# Patient Record
Sex: Female | Born: 2005 | Race: White | Hispanic: No | Marital: Single | Smoking: Never smoker
Health system: Southern US, Community
[De-identification: ages and names within clinical notes are randomized; demographics above are authoritative.]

---
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
## 2024-02-02 ENCOUNTER — Ambulatory Visit: Admitting: Family Medicine

## 2024-04-29 ENCOUNTER — Encounter (HOSPITAL_BASED_OUTPATIENT_CLINIC_OR_DEPARTMENT_OTHER): Payer: Self-pay

## 2024-04-29 ENCOUNTER — Emergency Department (HOSPITAL_BASED_OUTPATIENT_CLINIC_OR_DEPARTMENT_OTHER)
Admission: EM | Admit: 2024-04-29 | Discharge: 2024-04-30 | Disposition: A | Discharge status: Home / Self Care | Attending: Emergency Medicine | Admitting: Emergency Medicine

## 2024-04-29 ENCOUNTER — Other Ambulatory Visit: Payer: Self-pay

## 2024-04-29 DIAGNOSIS — R1012 Left upper quadrant pain: Secondary | ICD-10-CM | POA: Insufficient documentation

## 2024-04-29 LAB — CBC
HCT: 44.2 % (ref 36.0–46.0)
Hemoglobin: 14.8 g/dL (ref 12.0–15.0)
MCH: 27.9 pg (ref 26.0–34.0)
MCHC: 33.5 g/dL (ref 30.0–36.0)
MCV: 83.4 fL (ref 80.0–100.0)
Platelets: 391 K/uL (ref 150–400)
RBC: 5.3 MIL/uL — ABNORMAL HIGH (ref 3.87–5.11)
RDW: 14 % (ref 11.5–15.5)
WBC: 12.1 K/uL — ABNORMAL HIGH (ref 4.0–10.5)
nRBC: 0 % (ref 0.0–0.2)

## 2024-04-29 NOTE — ED Triage Notes (Signed)
 Upper L sided abdominal pain starting at 1300 today. Tender to palpation. Denies vomiting, change in bowel habits.

## 2024-04-30 ENCOUNTER — Emergency Department (HOSPITAL_BASED_OUTPATIENT_CLINIC_OR_DEPARTMENT_OTHER)

## 2024-04-30 LAB — COMPREHENSIVE METABOLIC PANEL WITH GFR
ALT: 13 U/L (ref 0–44)
AST: 18 U/L (ref 15–41)
Albumin: 4.9 g/dL (ref 3.5–5.0)
Alkaline Phosphatase: 95 U/L (ref 38–126)
Anion gap: 13 (ref 5–15)
BUN: 12 mg/dL (ref 6–20)
CO2: 22 mmol/L (ref 22–32)
Calcium: 10 mg/dL (ref 8.9–10.3)
Chloride: 103 mmol/L (ref 98–111)
Creatinine, Ser: 0.79 mg/dL (ref 0.44–1.00)
GFR, Estimated: >60 mL/min (ref >60)
Glucose, Bld: 92 mg/dL (ref 70–99)
Potassium: 4.1 mmol/L (ref 3.5–5.1)
Sodium: 138 mmol/L (ref 135–145)
Total Bilirubin: 0.5 mg/dL (ref 0.0–1.2)
Total Protein: 8.1 g/dL (ref 6.5–8.1)

## 2024-04-30 LAB — URINALYSIS, ROUTINE W REFLEX MICROSCOPIC
Bacteria, UA: NONE SEEN
Bilirubin Urine: NEGATIVE
Glucose, UA: NEGATIVE mg/dL
Hgb urine dipstick: NEGATIVE
Ketones, ur: 15 mg/dL — AB
Nitrite: NEGATIVE
Specific Gravity, Urine: 1.039 — ABNORMAL HIGH (ref 1.005–1.030)
pH: 6 (ref 5.0–8.0)

## 2024-04-30 LAB — PREGNANCY, URINE: Preg Test, Ur: NEGATIVE

## 2024-04-30 LAB — LIPASE, BLOOD: Lipase: 26 U/L (ref 11–51)

## 2024-04-30 MED ORDER — KETOROLAC TROMETHAMINE 30 MG/ML IJ SOLN
30.0000 mg | Freq: Once | INTRAMUSCULAR | Status: AC
  Administered 2024-04-30: 30 mg via INTRAVENOUS
  Filled 2024-04-30: qty 1

## 2024-04-30 MED ORDER — IOHEXOL 300 MG/ML  SOLN
65.0000 mL | Freq: Once | INTRAMUSCULAR | Status: AC | PRN
  Administered 2024-04-30: 85 mL via INTRAVENOUS

## 2024-04-30 MED ORDER — IBUPROFEN 600 MG PO TABS: 600.0000 mg | ORAL_TABLET | Freq: Four times a day (QID) | ORAL | 0 refills | Status: DC | PRN

## 2024-04-30 NOTE — Discharge Instructions (Signed)
 You were seen today for left upper quadrant pain.  Your workup is reassuring.  Trial ibuprofen  to see if this helps at all.

## 2024-04-30 NOTE — ED Provider Notes (Signed)
 [place] [facility] AT [facility] Provider Note   CSN: [id number] Arrival date & time: 04/29/24  2319     Patient presents with: Abdominal Pain   Colleen Conrad is a 18 y.o. female.   HPI     This is a 18 year old female who presents with left upper abdominal pain.  Patient states onset of symptoms around 12 hours ago.  Reports sharp left upper abdominal pain.  Worse with movement.  No change in bowels.  Denies nausea or vomiting.  Denies urinary symptoms.  No fevers.  No recent upper respiratory symptoms or sore throat.  Prior to Admission medications   Medication Sig Start Date End Date Taking? Authorizing Provider  ibuprofen  (ADVIL ) 600 MG tablet Take 1 tablet (600 mg total) by mouth every 6 (six) hours as needed. 04/30/24  Yes Rickiya Picariello, Charmaine FALCON, MD    Allergies: Patient has no allergy information on record.    Review of Systems  Constitutional:  Negative for fever.  Respiratory:  Negative for shortness of breath.   Cardiovascular:  Negative for chest pain.  Gastrointestinal:  Positive for abdominal pain. Negative for constipation, diarrhea, nausea and vomiting.  All other systems reviewed and are negative.   Updated Vital Signs BP 122/65   Pulse 72   Temp 98.2 F (36.8 C) (Oral)   Resp 18   LMP 04/13/2024 (Approximate)   SpO2 98%   Physical Exam Vitals and nursing note reviewed.  Constitutional:      Appearance: She is well-developed. She is not ill-appearing.     Comments: Overweight  HENT:     Head: Normocephalic and atraumatic.  Eyes:     Pupils: Pupils are equal, round, and reactive to light.  Cardiovascular:     Rate and Rhythm: Normal rate and regular rhythm.     Heart sounds: Normal heart sounds.  Pulmonary:     Effort: Pulmonary effort is normal. No respiratory distress.     Breath sounds: No wheezing.  Abdominal:     General: Bowel sounds are normal.     Palpations: Abdomen is soft.     Tenderness: There is abdominal  tenderness in the left upper quadrant. There is no guarding or rebound.  Musculoskeletal:     Cervical back: Neck supple.  Skin:    General: Skin is warm and dry.  Neurological:     Mental Status: She is alert and oriented to person, place, and time.  Psychiatric:        Mood and Affect: Mood normal.     (all labs ordered are listed, but only abnormal results are displayed) Labs Reviewed  CBC - Abnormal; Notable for the following components:      Result Value   WBC 12.1 (*)    RBC 5.30 (*)    All other components within normal limits  URINALYSIS, ROUTINE W REFLEX MICROSCOPIC - Abnormal; Notable for the following components:   Specific Gravity, Urine 1.039 (*)    Ketones, ur 15 (*)    Protein, ur TRACE (*)    Leukocytes,Ua SMALL (*)    All other components within normal limits  LIPASE, BLOOD  COMPREHENSIVE METABOLIC PANEL WITH GFR  PREGNANCY, URINE    EKG: None  Radiology: CT ABDOMEN PELVIS W CONTRAST Result Date: 04/30/2024 CLINICAL DATA:  Left side abdominal pain EXAM: CT ABDOMEN AND PELVIS WITH CONTRAST TECHNIQUE: Multidetector CT imaging of the abdomen and pelvis was performed using the standard protocol following bolus administration of intravenous contrast. RADIATION DOSE REDUCTION:  This exam was performed according to the departmental dose-optimization program which includes automated exposure control, adjustment of the mA and/or kV according to patient size and/or use of iterative reconstruction technique. CONTRAST:  85mL OMNIPAQUE  IOHEXOL  300 MG/ML  SOLN COMPARISON:  None Available. FINDINGS: Lower chest: No acute findings Hepatobiliary: No focal hepatic abnormality. Gallbladder unremarkable. Pancreas: No focal abnormality or ductal dilatation. Spleen: No focal abnormality.  Normal size. Adrenals/Urinary Tract: No adrenal abnormality. No focal renal abnormality. No stones or hydronephrosis. Urinary bladder is unremarkable. Stomach/Bowel: Normal appendix. Stomach, large and  small bowel grossly unremarkable. Vascular/Lymphatic: No evidence of aneurysm or adenopathy. Reproductive: Uterus and adnexa unremarkable.  No mass. Other: Trace free fluid in the cul-de-sac.  No free air. Musculoskeletal: No acute bony abnormality. IMPRESSION: No acute findings in the abdomen or pelvis. Electronically Signed   By: Franky Crease M.D.   On: 04/30/2024 01:23     Procedures   Medications Ordered in the ED  ketorolac  (TORADOL ) 30 MG/ML injection 30 mg (30 mg Intravenous Given 04/30/24 0023)  iohexol  (OMNIPAQUE ) 300 MG/ML solution 65 mL (85 mLs Intravenous Contrast Given 04/30/24 0116)    Clinical Course as of 04/30/24 0135  Mon Apr 30, 2024  0015 Discussed with patient and her mother lab results.  She does have significant left upper quadrant pain to palpation.  There are some more rare entities including spontaneous splenic bleed which are possibilities although hemoglobin would suggest otherwise.  They have opted for CT imaging. [CH]    Clinical Course User Index [CH] Nayara Taplin, Charmaine FALCON, MD                                 Medical Decision Making Amount and/or Complexity of Data Reviewed Labs: ordered. Radiology: ordered.  Risk Prescription drug management.   This patient presents to the ED for concern of abdominal pain, this involves an extensive number of treatment options, and is a complaint that carries with it a high risk of complications and morbidity.  I considered the following differential and admission for this acute, potentially life threatening condition.  The differential diagnosis includes colitis, kidney stone, pyelonephritis, splenic enlargement/injury, musculoskeletal etiology  MDM:    This is a 18 year old female who presents with left upper quadrant pain.  She is nontoxic and vital signs are reassuring.  She has tenderness to the left upper quadrant without rebound or guarding.  Labs obtained and reviewed.  Only notable for a white count of 12.1.  Normal  hemoglobin.  Normal CMP.  Urinalysis is not consistent with a UTI and urine pregnancy is negative.  See clinical course above.  CT imaging obtained.  No obvious intra-abdominal pathology.  While she is tender, could be musculoskeletal in nature.  Will trial anti-inflammatories.  (Labs, imaging, consults)  Labs: I Ordered, and personally interpreted labs.  The pertinent results include: CBC, CMP, urinalysis, urine pregnancy  Imaging Studies ordered: I ordered imaging studies including CT I independently visualized and interpreted imaging. I agree with the radiologist interpretation  Additional history obtained from chart review.  External records from outside source obtained and reviewed including prior evaluations  Cardiac Monitoring: The patient was maintained on a cardiac monitor.  If on the cardiac monitor, I personally viewed and interpreted the cardiac monitored which showed an underlying rhythm of: Sinus  Reevaluation: After the interventions noted above, I reevaluated the patient and found that they have :stayed the same  Social Determinants  of Health:  lives independently  Disposition: Discharge  Co morbidities that complicate the patient evaluation History reviewed. No pertinent past medical history.   Medicines Meds ordered this encounter  Medications   ketorolac  (TORADOL ) 30 MG/ML injection 30 mg   iohexol  (OMNIPAQUE ) 300 MG/ML solution 65 mL   ibuprofen  (ADVIL ) 600 MG tablet    Sig: Take 1 tablet (600 mg total) by mouth every 6 (six) hours as needed.    Dispense:  30 tablet    Refill:  0    I have reviewed the patients home medicines and have made adjustments as needed  Problem List / ED Course: Problem List Items Addressed This Visit   None Visit Diagnoses       Acute LUQ pain    -  Primary                Final diagnoses:  Acute LUQ pain    ED Discharge Orders          Ordered    ibuprofen  (ADVIL ) 600 MG tablet  Every 6 hours PRN         04/30/24 0129               Bari Charmaine FALCON, MD 04/30/24 [phone]

## 2024-05-01 ENCOUNTER — Emergency Department (HOSPITAL_BASED_OUTPATIENT_CLINIC_OR_DEPARTMENT_OTHER)
Admission: EM | Admit: 2024-05-01 | Discharge: 2024-05-01 | Disposition: A | Discharge status: Home / Self Care | Attending: Emergency Medicine | Admitting: Emergency Medicine

## 2024-05-01 ENCOUNTER — Other Ambulatory Visit (HOSPITAL_BASED_OUTPATIENT_CLINIC_OR_DEPARTMENT_OTHER): Payer: Self-pay

## 2024-05-01 ENCOUNTER — Other Ambulatory Visit: Payer: Self-pay

## 2024-05-01 ENCOUNTER — Encounter (HOSPITAL_BASED_OUTPATIENT_CLINIC_OR_DEPARTMENT_OTHER): Payer: Self-pay | Admitting: Emergency Medicine

## 2024-05-01 DIAGNOSIS — N3000 Acute cystitis without hematuria: Secondary | ICD-10-CM | POA: Diagnosis not present

## 2024-05-01 DIAGNOSIS — R9431 Abnormal electrocardiogram [ECG] [EKG]: Secondary | ICD-10-CM | POA: Diagnosis not present

## 2024-05-01 DIAGNOSIS — R1010 Upper abdominal pain, unspecified: Secondary | ICD-10-CM

## 2024-05-01 DIAGNOSIS — R1012 Left upper quadrant pain: Secondary | ICD-10-CM | POA: Diagnosis not present

## 2024-05-01 LAB — CBC WITH DIFFERENTIAL/PLATELET
Abs Immature Granulocytes: 0.02 K/uL (ref 0.00–0.07)
Basophils Absolute: 0.1 K/uL (ref 0.0–0.1)
Basophils Relative: 1 %
Eosinophils Absolute: 0.1 K/uL (ref 0.0–0.5)
Eosinophils Relative: 2 %
HCT: 43.8 % (ref 36.0–46.0)
Hemoglobin: 14.7 g/dL (ref 12.0–15.0)
Immature Granulocytes: 0 %
Lymphocytes Relative: 28 %
Lymphs Abs: 2.2 K/uL (ref 0.7–4.0)
MCH: 28.1 pg (ref 26.0–34.0)
MCHC: 33.6 g/dL (ref 30.0–36.0)
MCV: 83.6 fL (ref 80.0–100.0)
Monocytes Absolute: 0.5 K/uL (ref 0.1–1.0)
Monocytes Relative: 6 %
Neutro Abs: 5.2 K/uL (ref 1.7–7.7)
Neutrophils Relative %: 63 %
Platelets: 322 K/uL (ref 150–400)
RBC: 5.24 MIL/uL — ABNORMAL HIGH (ref 3.87–5.11)
RDW: 13.7 % (ref 11.5–15.5)
WBC: 8.1 K/uL (ref 4.0–10.5)
nRBC: 0 % (ref 0.0–0.2)

## 2024-05-01 LAB — URINALYSIS, ROUTINE W REFLEX MICROSCOPIC
Bilirubin Urine: NEGATIVE
Glucose, UA: NEGATIVE mg/dL
Hgb urine dipstick: NEGATIVE
Ketones, ur: 15 mg/dL — AB
Nitrite: NEGATIVE
Specific Gravity, Urine: 1.034 — ABNORMAL HIGH (ref 1.005–1.030)
pH: 6 (ref 5.0–8.0)

## 2024-05-01 LAB — COMPREHENSIVE METABOLIC PANEL WITH GFR
ALT: 13 U/L (ref 0–44)
AST: 15 U/L (ref 15–41)
Albumin: 4.6 g/dL (ref 3.5–5.0)
Alkaline Phosphatase: 91 U/L (ref 38–126)
Anion gap: 14 (ref 5–15)
BUN: 9 mg/dL (ref 6–20)
CO2: 21 mmol/L — ABNORMAL LOW (ref 22–32)
Calcium: 10 mg/dL (ref 8.9–10.3)
Chloride: 104 mmol/L (ref 98–111)
Creatinine, Ser: 0.8 mg/dL (ref 0.44–1.00)
GFR, Estimated: >60 mL/min (ref >60)
Glucose, Bld: 99 mg/dL (ref 70–99)
Potassium: 4.2 mmol/L (ref 3.5–5.1)
Sodium: 139 mmol/L (ref 135–145)
Total Bilirubin: 0.4 mg/dL (ref 0.0–1.2)
Total Protein: 7.7 g/dL (ref 6.5–8.1)

## 2024-05-01 LAB — LIPASE, BLOOD: Lipase: 29 U/L (ref 11–51)

## 2024-05-01 MED ORDER — FENTANYL CITRATE PF 50 MCG/ML IJ SOSY
50.0000 ug | PREFILLED_SYRINGE | Freq: Once | INTRAMUSCULAR | Status: AC
  Administered 2024-05-01: 50 ug via INTRAVENOUS
  Filled 2024-05-01: qty 1

## 2024-05-01 MED ORDER — CEPHALEXIN 500 MG PO CAPS
500.0000 mg | ORAL_CAPSULE | Freq: Four times a day (QID) | ORAL | 0 refills | Status: AC
  Filled 2024-05-01: qty 28, 7d supply, fill #0

## 2024-05-01 MED ORDER — OMEPRAZOLE 20 MG PO CPDR
20.0000 mg | DELAYED_RELEASE_CAPSULE | Freq: Every day | ORAL | 0 refills | Status: AC
  Filled 2024-05-01: qty 30, 30d supply, fill #0

## 2024-05-01 MED ORDER — SODIUM CHLORIDE 0.9 % IV BOLUS
1000.0000 mL | Freq: Once | INTRAVENOUS | Status: AC
  Administered 2024-05-01: 1000 mL via INTRAVENOUS

## 2024-05-01 NOTE — Discharge Instructions (Addendum)
 As we discussed, gradually increase your oral intake over the next several days starting with today only taking an liquids.  Progressively step up to soft or easier to digest foods as tolerated until you are eventually able to resume your normal diet over the next several days.  Otherwise, continue as prescribed medications as noted, follow-up with your primary care for continued management.

## 2024-05-01 NOTE — ED Triage Notes (Signed)
 C/o LUQ pain since 8/17. Seen here for same. Negative CT. +diarrhea.

## 2024-05-01 NOTE — ED Provider Notes (Signed)
 [place] EMERGENCY DEPARTMENT AT [facility] Provider Note   CSN: [id number] Arrival date & time: 05/01/24  9197     Patient presents with: Abdominal Pain   Colleen Conrad is a 18 y.o. female who presents to the ED today with continued left upper abdominal pain, states that it is worse with rotation of the torso and with forward bending at the waist.  She was seen on 17 August for the same, extensive workup undertaken at that time with CT not showing any acute abnormalities, and lab evaluation largely unremarkable.  Her primary concern today is that it feels like the left upper abdominal pain has worsened, and that she continues to have poor tolerance of oral intake.  In addition to the symptoms she has also had 2 loose stools this morning.  Endorses rare alcohol consumption, and occasional use of cannabis.  Has not used either in the last week.    Abdominal Pain Associated symptoms: diarrhea        Prior to Admission medications   Medication Sig Start Date End Date Taking? Authorizing Provider  cephALEXin  (KEFLEX ) 500 MG capsule Take 1 capsule (500 mg total) by mouth 4 (four) times daily for 7 days. 05/01/24 05/08/24 Yes Myriam Dorn BROCKS, PA  omeprazole  (PRILOSEC) 20 MG capsule Take 1 capsule (20 mg total) by mouth daily. 05/01/24  Yes Myriam Dorn BROCKS, PA  ibuprofen  (ADVIL ) 600 MG tablet Take 1 tablet (600 mg total) by mouth every 6 (six) hours as needed. 04/30/24   Horton, Charmaine FALCON, MD    Allergies: Patient has no allergy information on record.    Review of Systems  Gastrointestinal:  Positive for abdominal pain and diarrhea.  All other systems reviewed and are negative.   Updated Vital Signs BP 112/68   Pulse 62   Temp 98.9 F (37.2 C) (Oral)   Resp 15   LMP 04/13/2024 (Approximate)   SpO2 99%   Physical Exam Vitals and nursing note reviewed.  Constitutional:      General: She is not in acute distress.    Appearance: Normal appearance.  HENT:      Head: Normocephalic and atraumatic.     Mouth/Throat:     Mouth: Mucous membranes are moist.     Pharynx: Oropharynx is clear.  Eyes:     Extraocular Movements: Extraocular movements intact.     Conjunctiva/sclera: Conjunctivae normal.     Pupils: Pupils are equal, round, and reactive to light.  Cardiovascular:     Rate and Rhythm: Normal rate and regular rhythm.     Pulses: Normal pulses.     Heart sounds: Normal heart sounds. No murmur heard.    No friction rub. No gallop.  Pulmonary:     Effort: Pulmonary effort is normal.     Breath sounds: Normal breath sounds.  Abdominal:     General: Abdomen is flat. Bowel sounds are normal.     Palpations: Abdomen is soft.     Tenderness: There is generalized abdominal tenderness.  Musculoskeletal:        General: Normal range of motion.     Cervical back: Normal range of motion and neck supple.     Right lower leg: No edema.     Left lower leg: No edema.  Skin:    General: Skin is warm and dry.     Capillary Refill: Capillary refill takes less than 2 seconds.  Neurological:     General: No focal deficit present.  Mental Status: She is alert. Mental status is at baseline.  Psychiatric:        Mood and Affect: Mood normal.     (all labs ordered are listed, but only abnormal results are displayed) Labs Reviewed  COMPREHENSIVE METABOLIC PANEL WITH GFR - Abnormal; Notable for the following components:      Result Value   CO2 21 (*)    All other components within normal limits  CBC WITH DIFFERENTIAL/PLATELET - Abnormal; Notable for the following components:   RBC 5.24 (*)    All other components within normal limits  URINALYSIS, ROUTINE W REFLEX MICROSCOPIC - Abnormal; Notable for the following components:   Specific Gravity, Urine 1.034 (*)    Ketones, ur 15 (*)    Protein, ur TRACE (*)    Leukocytes,Ua MODERATE (*)    Bacteria, UA RARE (*)    All other components within normal limits  LIPASE, BLOOD    EKG: EKG  Interpretation Date/Time:  Tuesday May 01 2024 10:55:16 EDT Ventricular Rate:  54 PR Interval:  133 QRS Duration:  87 QT Interval:  431 QTC Calculation: 409 R Axis:   88  Text Interpretation: Sinus rhythm no prior ECG for comaprison No STEMI Confirmed by Ginger Barefoot ([id number]) on 05/01/2024 11:14:39 AM  Radiology: CT ABDOMEN PELVIS W CONTRAST Result Date: 04/30/2024 CLINICAL DATA:  Left side abdominal pain EXAM: CT ABDOMEN AND PELVIS WITH CONTRAST TECHNIQUE: Multidetector CT imaging of the abdomen and pelvis was performed using the standard protocol following bolus administration of intravenous contrast. RADIATION DOSE REDUCTION: This exam was performed according to the departmental dose-optimization program which includes automated exposure control, adjustment of the mA and/or kV according to patient size and/or use of iterative reconstruction technique. CONTRAST:  85mL OMNIPAQUE  IOHEXOL  300 MG/ML  SOLN COMPARISON:  None Available. FINDINGS: Lower chest: No acute findings Hepatobiliary: No focal hepatic abnormality. Gallbladder unremarkable. Pancreas: No focal abnormality or ductal dilatation. Spleen: No focal abnormality.  Normal size. Adrenals/Urinary Tract: No adrenal abnormality. No focal renal abnormality. No stones or hydronephrosis. Urinary bladder is unremarkable. Stomach/Bowel: Normal appendix. Stomach, large and small bowel grossly unremarkable. Vascular/Lymphatic: No evidence of aneurysm or adenopathy. Reproductive: Uterus and adnexa unremarkable.  No mass. Other: Trace free fluid in the cul-de-sac.  No free air. Musculoskeletal: No acute bony abnormality. IMPRESSION: No acute findings in the abdomen or pelvis. Electronically Signed   By: Franky Crease M.D.   On: 04/30/2024 01:23     Procedures   Medications Ordered in the ED  fentaNYL  (SUBLIMAZE ) injection 50 mcg (50 mcg Intravenous Given 05/01/24 1134)  sodium chloride  0.9 % bolus 1,000 mL (0 mLs Intravenous Stopped 05/01/24 1340)                                     Medical Decision Making Amount and/or Complexity of Data Reviewed Labs: ordered.  Risk Prescription drug management.   Medical Decision Making:   Colleen Conrad is a 18 y.o. female who presented to the ED today with left upper abdominal pain detailed above.    External chart has been reviewed including previous labs and imaging. Complete initial physical exam performed, notably the patient  was alert oriented no apparent distress with exquisite tenderness noted to the left upper quadrant..    Reviewed and confirmed nursing documentation for past medical history, family history, social history.    Initial Assessment:   With the patient's presentation  of upper abdominal pain, consider differential diagnosis of acute pancreatitis, gastritis, hepatobiliary disease, diverticulitis, bowel obstruction, mesenteric ischemia, UTI.     Initial Plan:  As she had imaging within the last 24 hours of the abdomen without any acute findings, defer imaging at this time. Screening labs including CBC and Metabolic panel to evaluate for infectious or metabolic etiology of disease.  Include serum lipase. Urinalysis with reflex culture ordered to evaluate for UTI or relevant urologic/nephrologic pathology.  EKG to evaluate for cardiac pathology Objective evaluation as below reviewed   Initial Study Results:   Laboratory  All laboratory results reviewed without evidence of clinically relevant pathology.   Exceptions include: UA does show moderate leukocytes with trace proteins and presence of bacteria with 0-5 squamous epithelial cells.  EKG EKG was reviewed independently. Rate, rhythm, axis, intervals all examined and without medically relevant abnormality. ST segments without concerns for elevations.    Radiology:  All images reviewed independently. Agree with radiology report at this time.   CT ABDOMEN PELVIS W CONTRAST Result Date: 04/30/2024 CLINICAL DATA:   Left side abdominal pain EXAM: CT ABDOMEN AND PELVIS WITH CONTRAST TECHNIQUE: Multidetector CT imaging of the abdomen and pelvis was performed using the standard protocol following bolus administration of intravenous contrast. RADIATION DOSE REDUCTION: This exam was performed according to the departmental dose-optimization program which includes automated exposure control, adjustment of the mA and/or kV according to patient size and/or use of iterative reconstruction technique. CONTRAST:  85mL OMNIPAQUE  IOHEXOL  300 MG/ML  SOLN COMPARISON:  None Available. FINDINGS: Lower chest: No acute findings Hepatobiliary: No focal hepatic abnormality. Gallbladder unremarkable. Pancreas: No focal abnormality or ductal dilatation. Spleen: No focal abnormality.  Normal size. Adrenals/Urinary Tract: No adrenal abnormality. No focal renal abnormality. No stones or hydronephrosis. Urinary bladder is unremarkable. Stomach/Bowel: Normal appendix. Stomach, large and small bowel grossly unremarkable. Vascular/Lymphatic: No evidence of aneurysm or adenopathy. Reproductive: Uterus and adnexa unremarkable.  No mass. Other: Trace free fluid in the cul-de-sac.  No free air. Musculoskeletal: No acute bony abnormality. IMPRESSION: No acute findings in the abdomen or pelvis. Electronically Signed   By: Franky Crease M.D.   On: 04/30/2024 01:23    Reassessment and Plan:   Workup is otherwise reassuring except for new findings of increased leukocyte esterase in the urine along with presence of bacteria without substantial squamous epithelium in the sample.  Given this finding, will begin treatment for UTI.  Further provided patient with a liter of IV fluids and this helped improve her discomfort prior to discharge along with IV fentanyl .  Further pain management was recommended with over-the-counter medications, follow-up with primary care for monitoring of her progression.  She understands and agrees, has no further concerns at this time.        Final diagnoses:  Acute cystitis without hematuria  Upper abdominal pain    ED Discharge Orders          Ordered    cephALEXin  (KEFLEX ) 500 MG capsule  4 times daily        05/01/24 1333    omeprazole  (PRILOSEC) 20 MG capsule  Daily        05/01/24 1333               Myriam Dorn BROCKS, [place] 05/01/24 1858    Tegeler, Lonni PARAS, MD 05/04/24 [phone]

## 2024-05-08 ENCOUNTER — Ambulatory Visit (INDEPENDENT_AMBULATORY_CARE_PROVIDER_SITE_OTHER)

## 2024-05-08 VITALS — BP 113/69 | HR 76 | Temp 98.1°F | Ht 64.0 in | Wt 188.0 lb

## 2024-05-08 DIAGNOSIS — M5442 Lumbago with sciatica, left side: Secondary | ICD-10-CM | POA: Diagnosis not present

## 2024-05-08 DIAGNOSIS — R5383 Other fatigue: Secondary | ICD-10-CM | POA: Insufficient documentation

## 2024-05-08 DIAGNOSIS — Z13228 Encounter for screening for other metabolic disorders: Secondary | ICD-10-CM | POA: Diagnosis not present

## 2024-05-08 DIAGNOSIS — G8929 Other chronic pain: Secondary | ICD-10-CM | POA: Diagnosis not present

## 2024-05-08 DIAGNOSIS — N3001 Acute cystitis with hematuria: Secondary | ICD-10-CM

## 2024-05-08 DIAGNOSIS — M545 Low back pain, unspecified: Secondary | ICD-10-CM | POA: Insufficient documentation

## 2024-05-08 DIAGNOSIS — Z1321 Encounter for screening for nutritional disorder: Secondary | ICD-10-CM

## 2024-05-08 DIAGNOSIS — Z130 Encounter for screening for diseases of the blood and blood-forming organs and certain disorders involving the immune mechanism: Secondary | ICD-10-CM

## 2024-05-08 DIAGNOSIS — F411 Generalized anxiety disorder: Secondary | ICD-10-CM | POA: Insufficient documentation

## 2024-05-08 DIAGNOSIS — Z1329 Encounter for screening for other suspected endocrine disorder: Secondary | ICD-10-CM | POA: Diagnosis not present

## 2024-05-08 DIAGNOSIS — Z1159 Encounter for screening for other viral diseases: Secondary | ICD-10-CM

## 2024-05-08 DIAGNOSIS — N390 Urinary tract infection, site not specified: Secondary | ICD-10-CM | POA: Insufficient documentation

## 2024-05-08 MED ORDER — MELOXICAM 15 MG PO TABS: 15.0000 mg | ORAL_TABLET | Freq: Every day | ORAL | 2 refills | Status: DC

## 2024-05-08 MED ORDER — SERTRALINE HCL 50 MG PO TABS: 50.0000 mg | ORAL_TABLET | Freq: Every day | ORAL | 2 refills | Status: DC

## 2024-05-08 MED ORDER — HYDROXYZINE PAMOATE 50 MG PO CAPS: 50.0000 mg | ORAL_CAPSULE | Freq: Three times a day (TID) | ORAL | 0 refills | Status: DC | PRN

## 2024-05-08 NOTE — Progress Notes (Signed)
 New Patient Office Visit  Subjective    Patient ID: Colleen Conrad, female    DOB: 04/26/2006  Age: 18 y.o. MRN: [id number]  CC:  Chief Complaint  Patient presents with   New Patient (Initial Visit)   History of Present Illness     HPI Colleen Conrad presents to establish care. This is her first primary care appointment. Denies any significant PMH and is not on any daily medications.  Screenings:  Colon Cancer: N/a - denies fam hx Lung Cancer: N/A - nonsmoker Breast Cancer: N/A - denies fam hx  Diabetes: Checking A1c with labs HLD: Checking lipid panel with labs The ASCVD Risk score (Arnett DK, et al., 2019) failed to calculate for the following reasons:   The 2019 ASCVD risk score is only valid for ages 62 to 47   Acute Problems: Abdominal pain - Left upper abdominal pain began prior to August 18th - Pain was initially intermittent and not consistent - No definitive cause identified during emergency department evaluation on August 18th - Pain has become less consistent, now occurring on and off and improving in severity after starting abx for her UTI  Urinary tract infection - Diagnosed with UTI during emergency department visit on August 19th - Urinalysis showed blood and increased white blood cells - Currently taking Keflex  QID, with a few days of antibiotics remaining  Lower back and knee pain - Lower back pain severe enough at times to prevent standing - Pain radiates from lower back down to left knee, causing knee pain. Electric shock type pain. - No history of injury or falls - No medication use for back pain  Anxiety symptoms - History of anxiety, with worsening symptoms over the past few months - Experiences daily anxiety and periodic anxiety attacks - Previously treated with anxiety medication as a teenager, which caused sedation - Not currently taking medication for anxiety - Significant fatigue and lack of energy since the birth of her daughter - Impaired  ability to engage in previously enjoyed activities     Outpatient Encounter Medications as of 05/08/2024  Medication Sig   cephALEXin  (KEFLEX ) 500 MG capsule Take 1 capsule (500 mg total) by mouth 4 (four) times daily for 7 days.   hydrOXYzine  (VISTARIL ) 50 MG capsule Take 1 capsule (50 mg total) by mouth 3 (three) times daily as needed.   meloxicam  (MOBIC ) 15 MG tablet Take 1 tablet (15 mg total) by mouth daily.   sertraline  (ZOLOFT ) 50 MG tablet Take 1 tablet (50 mg total) by mouth daily.   omeprazole  (PRILOSEC) 20 MG capsule Take 1 capsule (20 mg total) by mouth daily.   [DISCONTINUED] ibuprofen  (ADVIL ) 600 MG tablet Take 1 tablet (600 mg total) by mouth every 6 (six) hours as needed.   No facility-administered encounter medications on file as of 05/08/2024.    No past medical history on file.  No past surgical history on file.  No family history on file.  Social History   Socioeconomic History   Marital status: Single    Spouse name: Not on file   Number of children: Not on file   Years of education: Not on file   Highest education level: Not on file  Occupational History   Not on file  Tobacco Use   Smoking status: Never   Smokeless tobacco: Not on file  Substance and Sexual Activity   Alcohol use: No   Drug use: Not on file   Sexual activity: Not on file  Other Topics  Concern   Not on file  Social History Narrative   Not on file   Social Drivers of Health   Financial Resource Strain: Not on file  Food Insecurity: No Food Insecurity (05/08/2024)   Hunger Vital Sign    Worried About Running Out of Food in the Last Year: Never true    Ran Out of Food in the Last Year: Never true  Transportation Needs: No Transportation Needs (05/08/2024)   PRAPARE - Administrator, Civil Service (Medical): No    Lack of Transportation (Non-Medical): No  Physical Activity: Not on file  Stress: Not on file  Social Connections: Not on file  Intimate Partner Violence:  Not At Risk (05/08/2024)   Humiliation, Afraid, Rape, and Kick questionnaire    Fear of Current or Ex-Partner: No    Emotionally Abused: No    Physically Abused: No    Sexually Abused: No    ROS  Per HPI      Objective    BP 113/69   Pulse 76   Temp 98.1 F (36.7 C) (Oral)   Ht 5' 4 (1.626 m)   Wt 188 lb (85.3 kg)   LMP 04/13/2024 (Approximate)   SpO2 96%   BMI 32.27 kg/m   Physical Exam Constitutional:      General: She is not in acute distress.    Appearance: Normal appearance.  Cardiovascular:     Rate and Rhythm: Normal rate and regular rhythm.     Heart sounds: Normal heart sounds. No murmur heard.    No friction rub. No gallop.  Pulmonary:     Effort: Pulmonary effort is normal. No respiratory distress.     Breath sounds: Normal breath sounds.  Musculoskeletal:        General: No swelling.  Skin:    General: Skin is warm and dry.  Neurological:     General: No focal deficit present.     Mental Status: She is alert.  Psychiatric:        Mood and Affect: Mood normal.        Behavior: Behavior normal.        Thought Content: Thought content normal.        Assessment & Plan:   Generalized anxiety disorder Assessment & Plan: Long-standing anxiety with recent exacerbation. Zoloft  considered appropriate. Hydroxyzine  prescribed for acute attacks. - Prescribe sertraline , starting with 25 mg for one week, then increase to 50 mg daily. - Prescribe hydroxyzine  50 mg for acute anxiety attacks, to be used as needed, with caution regarding drowsiness.  Orders: -     Sertraline  HCl; Take 1 tablet (50 mg total) by mouth daily.  Dispense: 90 tablet; Refill: 2 -     hydrOXYzine  Pamoate; Take 1 capsule (50 mg total) by mouth 3 (three) times daily as needed.  Dispense: 30 capsule; Refill: 0  Chronic bilateral low back pain with left-sided sciatica Assessment & Plan: Chronic low back pain with radicular symptoms, likely nerve or muscle irritation. X-ray required  before MRI due to insurance. - Order lumbar spine x-ray. - Prescribe meloxicam  for anti-inflammatory relief, to be taken with food. - Consider Flexeril if meloxicam  is insufficient, to be requested via MyChart if needed. - Send x-ray order to [facility] for walk-in appointment.  Orders: -     Meloxicam ; Take 1 tablet (15 mg total) by mouth daily.  Dispense: 30 tablet; Refill: 2 -     DG Lumbar Spine 2-3 Views; Future  Other fatigue  Assessment & Plan: Persistent fatigue post-childbirth. Differential includes thyroid dysfunction, anemia, vitamin D deficiency, and vitamin B12/folate deficiency. - Order fasting blood work to evaluate thyroid function, complete blood count, vitamin D, vitamin B12, and folate levels.    Acute cystitis with hematuria Assessment & Plan: Recent UTI with hematuria and leukocytosis, improving on Keflex . - Complete the course of Keflex  as prescribed.    Return in about 3 months (around 08/08/2024) for Mood.   Saddie JULIANNA Sacks, PA-C

## 2024-05-08 NOTE — Patient Instructions (Signed)
 VISIT SUMMARY: Today, you came in for a follow-up visit after your recent emergency department visit for abdominal pain and a urinary tract infection (UTI). We discussed your ongoing symptoms, including lower back and knee pain, anxiety, and fatigue.  YOUR PLAN: -URINARY TRACT INFECTION: A urinary tract infection (UTI) is an infection in any part of your urinary system. You are currently taking Keflex , and it is important to complete the full course of antibiotics as prescribed to ensure the infection is fully treated.  -LOW BACK PAIN WITH RADICULAR SYMPTOMS: Chronic low back pain with radicular symptoms means you have ongoing lower back pain that radiates to other areas, likely due to nerve or muscle irritation. We will start with a lumbar spine x-ray and have prescribed meloxicam  to help with inflammation. If meloxicam  is not enough, you can request Flexeril through MyChart. X-ray of lumbar spine sent to [facility] [address] [place], [place] [address] Phone [phone] You do not need an appointment for the x-ray. You can walk in at any time during business hours.   -GENERALIZED ANXIETY DISORDER: Generalized anxiety disorder is a condition characterized by persistent and excessive worry. We have prescribed sertraline  to help manage your anxiety, starting with a low dose and increasing it after one week. For acute anxiety attacks, you can use hydroxyzine  as needed, but be cautious as it may cause drowsiness.  -FATIGUE, UNDER EVALUATION: Fatigue is a feeling of extreme tiredness and lack of energy. We will conduct fasting blood work to check for possible causes such as thyroid issues, anemia, or vitamin deficiencies.  INSTRUCTIONS: Please complete the course of Keflex  for your UTI. For your lower back pain, get a lumbar spine x-ray at [facility]. Start taking meloxicam  with food, and if needed, request Flexeril through MyChart. Begin taking sertraline  for your anxiety,  starting with 25 mg for one week, then increasing to 50 mg daily. Use hydroxyzine  as needed for acute anxiety attacks. Lastly, get the fasting blood work done to evaluate your fatigue.  If you have any problems before your next visit feel free to message me via MyChart (minor issues or questions) or call the office, otherwise you may reach out to schedule an office visit.  Thank you! Saddie Sacks, PA-C

## 2024-05-08 NOTE — Assessment & Plan Note (Signed)
 Persistent fatigue post-childbirth. Differential includes thyroid dysfunction, anemia, vitamin D deficiency, and vitamin B12/folate deficiency. - Order fasting blood work to evaluate thyroid function, complete blood count, vitamin D, vitamin B12, and folate levels.

## 2024-05-08 NOTE — Assessment & Plan Note (Signed)
 Long-standing anxiety with recent exacerbation. Zoloft  considered appropriate. Hydroxyzine  prescribed for acute attacks. - Prescribe sertraline , starting with 25 mg for one week, then increase to 50 mg daily. - Prescribe hydroxyzine  50 mg for acute anxiety attacks, to be used as needed, with caution regarding drowsiness.

## 2024-05-08 NOTE — Assessment & Plan Note (Signed)
 Chronic low back pain with radicular symptoms, likely nerve or muscle irritation. X-ray required before MRI due to insurance. - Order lumbar spine x-ray. - Prescribe meloxicam  for anti-inflammatory relief, to be taken with food. - Consider Flexeril if meloxicam  is insufficient, to be requested via MyChart if needed. - Send x-ray order to [facility] for walk-in appointment.

## 2024-05-08 NOTE — Assessment & Plan Note (Signed)
 Recent UTI with hematuria and leukocytosis, improving on Keflex . - Complete the course of Keflex  as prescribed.

## 2024-05-18 ENCOUNTER — Other Ambulatory Visit

## 2024-05-18 DIAGNOSIS — Z1159 Encounter for screening for other viral diseases: Secondary | ICD-10-CM

## 2024-05-18 DIAGNOSIS — Z130 Encounter for screening for diseases of the blood and blood-forming organs and certain disorders involving the immune mechanism: Secondary | ICD-10-CM

## 2024-05-18 DIAGNOSIS — R5383 Other fatigue: Secondary | ICD-10-CM

## 2024-05-18 DIAGNOSIS — Z1321 Encounter for screening for nutritional disorder: Secondary | ICD-10-CM | POA: Diagnosis not present

## 2024-05-18 DIAGNOSIS — Z13228 Encounter for screening for other metabolic disorders: Secondary | ICD-10-CM | POA: Diagnosis not present

## 2024-05-18 DIAGNOSIS — Z1329 Encounter for screening for other suspected endocrine disorder: Secondary | ICD-10-CM | POA: Diagnosis not present

## 2024-05-19 LAB — COMPREHENSIVE METABOLIC PANEL WITH GFR
ALT: 14 IU/L (ref 0–32)
AST: 16 IU/L (ref 0–40)
Albumin: 4.8 g/dL (ref 4.0–5.0)
Alkaline Phosphatase: 93 IU/L (ref 42–106)
BUN/Creatinine Ratio: 11 (ref 9–23)
BUN: 10 mg/dL (ref 6–20)
Bilirubin Total: 0.6 mg/dL (ref 0.0–1.2)
CO2: 22 mmol/L (ref 20–29)
Calcium: 10 mg/dL (ref 8.7–10.2)
Chloride: 99 mmol/L (ref 96–106)
Creatinine, Ser: 0.87 mg/dL (ref 0.57–1.00)
Globulin, Total: 2.4 g/dL (ref 1.5–4.5)
Glucose: 82 mg/dL (ref 70–99)
Potassium: 4.4 mmol/L (ref 3.5–5.2)
Sodium: 138 mmol/L (ref 134–144)
Total Protein: 7.2 g/dL (ref 6.0–8.5)
eGFR: 98 mL/min/1.73 (ref >59)

## 2024-05-19 LAB — CBC WITH DIFFERENTIAL/PLATELET
Basophils Absolute: 0.1 x10E3/uL (ref 0.0–0.2)
Basos: 1 %
EOS (ABSOLUTE): 0.2 x10E3/uL (ref 0.0–0.4)
Eos: 4 %
Hematocrit: 47.4 % — ABNORMAL HIGH (ref 34.0–46.6)
Hemoglobin: 15 g/dL (ref 11.1–15.9)
Immature Grans (Abs): 0 x10E3/uL (ref 0.0–0.1)
Immature Granulocytes: 0 %
Lymphocytes Absolute: 1.7 x10E3/uL (ref 0.7–3.1)
Lymphs: 30 %
MCH: 28 pg (ref 26.6–33.0)
MCHC: 31.6 g/dL (ref 31.5–35.7)
MCV: 89 fL (ref 79–97)
Monocytes Absolute: 0.4 x10E3/uL (ref 0.1–0.9)
Monocytes: 7 %
Neutrophils Absolute: 3.2 x10E3/uL (ref 1.4–7.0)
Neutrophils: 58 %
Platelets: 327 x10E3/uL (ref 150–450)
RBC: 5.35 x10E6/uL — ABNORMAL HIGH (ref 3.77–5.28)
RDW: 13.4 % (ref 11.7–15.4)
WBC: 5.6 x10E3/uL (ref 3.4–10.8)

## 2024-05-19 LAB — TSH: TSH: 0.515 u[IU]/mL (ref 0.450–4.500)

## 2024-05-19 LAB — VITAMIN D 25 HYDROXY (VIT D DEFICIENCY, FRACTURES): Vit D, 25-Hydroxy: 29.8 ng/mL — ABNORMAL LOW (ref 30.0–100.0)

## 2024-05-19 LAB — HIV ANTIBODY (ROUTINE TESTING W REFLEX): HIV Screen 4th Generation wRfx: NONREACTIVE

## 2024-05-19 LAB — LIPID PANEL
Chol/HDL Ratio: 4.7 ratio — ABNORMAL HIGH (ref 0.0–4.4)
Cholesterol, Total: 183 mg/dL — ABNORMAL HIGH (ref 100–169)
HDL: 39 mg/dL — ABNORMAL LOW (ref >39)
LDL Chol Calc (NIH): 127 mg/dL — ABNORMAL HIGH (ref 0–109)
Triglycerides: 90 mg/dL — ABNORMAL HIGH (ref 0–89)
VLDL Cholesterol Cal: 17 mg/dL (ref 5–40)

## 2024-05-19 LAB — B12 AND FOLATE PANEL
Folate: 9.2 ng/mL (ref >3.0)
Vitamin B-12: 288 pg/mL (ref 232–1245)

## 2024-05-19 LAB — HEMOGLOBIN A1C
Est. average glucose Bld gHb Est-mCnc: 100 mg/dL
Hgb A1c MFr Bld: 5.1 % (ref 4.8–5.6)

## 2024-05-19 LAB — HEPATITIS C ANTIBODY: Hep C Virus Ab: NONREACTIVE

## 2024-05-21 ENCOUNTER — Ambulatory Visit: Payer: Self-pay

## 2024-06-18 ENCOUNTER — Ambulatory Visit

## 2024-06-18 VITALS — BP 117/63 | HR 74 | Temp 97.6°F | Ht 64.0 in | Wt 176.1 lb

## 2024-06-18 DIAGNOSIS — Z23 Encounter for immunization: Secondary | ICD-10-CM | POA: Diagnosis not present

## 2024-06-18 DIAGNOSIS — Z111 Encounter for screening for respiratory tuberculosis: Secondary | ICD-10-CM | POA: Diagnosis not present

## 2024-06-18 DIAGNOSIS — J029 Acute pharyngitis, unspecified: Secondary | ICD-10-CM

## 2024-06-18 DIAGNOSIS — J020 Streptococcal pharyngitis: Secondary | ICD-10-CM | POA: Diagnosis not present

## 2024-06-18 LAB — POCT RAPID STREP A (OFFICE): Rapid Strep A Screen: POSITIVE — AB

## 2024-06-18 MED ORDER — AMOXICILLIN-POT CLAVULANATE 875-125 MG PO TABS: 1.0000 | ORAL_TABLET | Freq: Two times a day (BID) | ORAL | 0 refills | Status: AC

## 2024-06-18 NOTE — Progress Notes (Signed)
   Acute Office Visit  Subjective:     Patient ID: Colleen Conrad, female    DOB: 2005/12/01, 18 y.o.   MRN: [id number]  Chief Complaint  Patient presents with   Sore Throat    Onset:     HPI   History of Present Illness   Colleen Conrad is a 18 year old female who presents with a sore throat.  Pharyngodynia (sore throat) - Severe throat pain, started yesterday with pain with swallowing. Denies fevers, cough, nasal congestion, etc. Described as 'bad back there' and 'scary' - No prior history of strep throat - No current home remedies or over the counter treatment - Here to get flu vaccine and TB test for school and questioning if she can still get them today        ROS Per HPI     Objective:    BP 117/63   Pulse 74   Temp 97.6 F (36.4 C) (Oral)   Ht 5' 4 (1.626 m)   Wt 176 lb 1.9 oz (79.9 kg)   SpO2 97%   BMI 30.23 kg/m    Physical Exam HENT:     Head: Normocephalic.     Right Ear: Tympanic membrane normal.     Left Ear: Tympanic membrane normal.     Mouth/Throat:     Pharynx: Oropharyngeal exudate and posterior oropharyngeal erythema present.     Tonsils: Tonsillar exudate present. 2+ on the right. 2+ on the left.  Cardiovascular:     Rate and Rhythm: Normal rate and regular rhythm.  Pulmonary:     Effort: Pulmonary effort is normal.     Breath sounds: Normal breath sounds.  Lymphadenopathy:     Cervical: Cervical adenopathy present.  Skin:    General: Skin is warm and dry.  Neurological:     General: No focal deficit present.     Mental Status: She is alert.      Results for orders placed or performed in visit on 06/18/24  POCT rapid strep A  Result Value Ref Range   Rapid Strep A Screen Positive (A) Negative        Assessment & Plan:      Sore throat -     POCT rapid strep A  Screening for tuberculosis -     QuantiFERON-TB Gold Plus  Encounter for vaccination -     Flu vaccine trivalent PF, 6mos and  older(Flulaval,Afluria,Fluarix,Fluzone)  Streptococcal pharyngitis Assessment & Plan: Acute streptococcal pharyngitis confirmed. No prior episodes. No drug allergies. - Prescribe Augmentin BID with food. - Advise ibuprofen  every 4-6 hours for pain. - Recommend chloraseptic spray or lozenges. - Instruct to change toothbrush post-antibiotics. - Advise rest and hydration with warm fluids. - Instruct to notify if symptoms worsen or persist beyond 72 hours or after completion of abx.  -Ok to get flu shot and TB test today given lack of fever and mild illness.   Other orders -     Amoxicillin-Pot Clavulanate; Take 1 tablet by mouth 2 (two) times daily for 7 days.  Dispense: 14 tablet; Refill: 0            Return if symptoms worsen or fail to improve.  Saddie JULIANNA Sacks, PA-C

## 2024-06-18 NOTE — Assessment & Plan Note (Signed)
 Acute streptococcal pharyngitis confirmed. No prior episodes. No drug allergies. - Prescribe Augmentin BID with food. - Advise ibuprofen  every 4-6 hours for pain. - Recommend chloraseptic spray or lozenges. - Instruct to change toothbrush post-antibiotics. - Advise rest and hydration with warm fluids. - Instruct to notify if symptoms worsen or persist beyond 72 hours or after completion of abx.  -Ok to get flu shot and TB test today given lack of fever and mild illness.

## 2024-06-18 NOTE — Patient Instructions (Signed)
 VISIT SUMMARY: Today, you were seen for a sore throat. After evaluation, it was confirmed that you have acute streptococcal pharyngitis, commonly known as strep throat. We also discussed general health maintenance, including your flu shot and a TB test for school.  YOUR PLAN: -STREPTOCOCCAL PHARYNGITIS: Streptococcal pharyngitis, or strep throat, is a bacterial infection that causes severe throat pain. You have been prescribed Augmentin to take twice a day with food. For pain relief, you can take ibuprofen  every 4-6 hours. Additionally, you can use chloraseptic spray or lozenges to soothe your throat. Remember to change your toothbrush after completing the antibiotics. Rest and drink warm fluids to stay hydrated. If your symptoms worsen or do not improve within 48 hours, please contact us .  -GENERAL HEALTH MAINTENANCE: We discussed the importance of getting a flu shot and a TB test for your school requirements. You received your flu shot today, and we have ordered a TB blood test for you.  INSTRUCTIONS: Please follow up if your symptoms worsen or do not improve within 48 hours. Additionally, ensure you complete the TB blood test as ordered.  If you have any problems before your next visit feel free to message me via MyChart (minor issues or questions) or call the office, otherwise you may reach out to schedule an office visit.  Thank you! Saddie Sacks, PA-C

## 2024-06-21 LAB — QUANTIFERON-TB GOLD PLUS
QuantiFERON Mitogen Value: >10 [IU]/mL
QuantiFERON Nil Value: 0.05 [IU]/mL
QuantiFERON TB1 Ag Value: 0.05 [IU]/mL
QuantiFERON TB2 Ag Value: 0.05 [IU]/mL

## 2024-07-16 DIAGNOSIS — J020 Streptococcal pharyngitis: Secondary | ICD-10-CM | POA: Diagnosis not present

## 2024-07-16 DIAGNOSIS — J029 Acute pharyngitis, unspecified: Secondary | ICD-10-CM | POA: Diagnosis not present

## 2024-07-26 ENCOUNTER — Other Ambulatory Visit: Payer: Self-pay

## 2024-07-26 DIAGNOSIS — F411 Generalized anxiety disorder: Secondary | ICD-10-CM

## 2024-08-08 ENCOUNTER — Ambulatory Visit (INDEPENDENT_AMBULATORY_CARE_PROVIDER_SITE_OTHER)

## 2024-08-08 VITALS — BP 109/75 | HR 57 | Temp 98.5°F | Ht 64.0 in | Wt 168.1 lb

## 2024-08-08 DIAGNOSIS — G4700 Insomnia, unspecified: Secondary | ICD-10-CM | POA: Diagnosis not present

## 2024-08-08 DIAGNOSIS — F411 Generalized anxiety disorder: Secondary | ICD-10-CM

## 2024-08-08 DIAGNOSIS — F909 Attention-deficit hyperactivity disorder, unspecified type: Secondary | ICD-10-CM | POA: Insufficient documentation

## 2024-08-08 MED ORDER — DEXMETHYLPHENIDATE HCL ER 15 MG PO CP24: 15.0000 mg | ORAL_CAPSULE | Freq: Every day | ORAL | 0 refills | Status: DC

## 2024-08-08 NOTE — Patient Instructions (Signed)
 VISIT SUMMARY: Today, we discussed your current medications and symptoms related to ADHD, anxiety, and sleep disturbances. We made some adjustments to your medication plan to better manage your symptoms, especially around your menstrual cycle.  YOUR PLAN: DEPRESSION AND PMDD: Your depression is currently managed with Zoloft , but you've experienced increased anger and anxiety, likely related to PMDD. -Increase Zoloft  to 100 mg daily during the week before your period. -Monitor your symptoms and adjust the dosage as needed.  ADHD: You have a history of ADHD and are interested in restarting medication as you return to school. -Start taking Focalin  extended release 15 mg daily. -We will have a follow-up in three months to see how you are responding to the medication. -You have the option for flexible follow-up appointments, including video visits.  INSOMNIA: You have been using hydroxyzine  to help with sleep, which has been effective. -Continue taking hydroxyzine  as needed for sleep. -Ensure you have refills available at the pharmacy.  GENERAL HEALTH MAINTENANCE: We reviewed your immunization status and discussed the HPV vaccine. -We will confirm your HPV vaccination status with your previous pediatric records. -Consider getting the HPV vaccine if you haven't had it before.  If you have any problems before your next visit feel free to message me via MyChart (minor issues or questions) or call the office, otherwise you may reach out to schedule an office visit.  Thank you! Saddie Sacks, PA-C

## 2024-08-08 NOTE — Assessment & Plan Note (Signed)
 ADHD diagnosed in childhood, treated with Focalin  for many years. Discussed Focalin  extended release 15 mg daily. Explained controlled substance regulations and follow-up requirements. Will attempt to obtain medical records from pediatrician.  - Prescribed Focalin  extended release 15 mg daily. - Follow up in 4 weeks to assess efficacy and adjust dosage if needed

## 2024-08-08 NOTE — Progress Notes (Signed)
 Established Patient Office Visit  Subjective   Patient ID: Colleen Conrad, female    DOB: [birth year]  Age: 18 y.o. MRN: [id number]  Chief Complaint  Patient presents with   Medication Management    HPI  History of Present Illness   Colleen Conrad is a 18 year old female with ADHD and anxiety who presents for medication management.  Mood disturbance and anxiety - Currently taking sertraline  50 mg daily, with initial improvement in symptoms, particularly anger. - Recent decrease in medication effectiveness, especially around the menstrual cycle. - Increased anger and mood changes during the week before and during menses.  Attention deficit and hyperactivity symptoms - History of ADHD, previously treated with Focalin  during high school with beneficial effect but she is unsure what the dose was.  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          ROS Per HPI.    Objective:     BP 109/75   Pulse (!) 57   Temp 98.5 F (36.9 C) (Oral)   Ht 5' 4 (1.626 m)   Wt 168 lb 1.9 oz (76.3 kg)   LMP 08/05/2024   SpO2 98%   BMI 28.86 kg/m    Physical Exam Constitutional:      General: She is not in acute distress.    Appearance: Normal appearance.  Cardiovascular:     Rate and Rhythm: Normal rate and regular rhythm.     Heart sounds: Normal heart sounds. No murmur heard.    No friction rub. No gallop.  Pulmonary:     Effort: Pulmonary effort is normal. No respiratory distress.     Breath sounds: Normal breath sounds.  Musculoskeletal:        General: No swelling.     Cervical back: Neck supple.  Lymphadenopathy:     Cervical: No cervical adenopathy.  Skin:    General: Skin is warm and dry.  Neurological:     General: No focal deficit present.     Mental Status: She is alert.   Psychiatric:        Mood and Affect: Mood normal.        Behavior: Behavior normal.        Thought Content: Thought content normal.      No results found for any visits on 08/08/24.    The ASCVD Risk score (Arnett DK, et al., 2019) failed to calculate for the following reasons:   The 2019 ASCVD risk score is only valid for ages 71 to 67    Assessment & Plan:   Attention deficit hyperactivity disorder (ADHD), unspecified ADHD type Assessment & Plan: ADHD diagnosed in childhood, treated with Focalin  for many years. Discussed Focalin  extended release 15 mg daily. Explained controlled substance regulations and follow-up requirements. Will attempt to obtain medical records from pediatrician.  - Prescribed Focalin  extended release 15 mg daily. - Follow up in 4 weeks to assess efficacy and adjust dosage if needed  Orders: -     Dexmethylphenidate  HCl ER; Take 1 capsule (15 mg total) by mouth daily.  Dispense: 30 capsule; Refill: 0  Generalized anxiety disorder Assessment & Plan: Anxiety managed with Zoloft  50 mg daily. Recent increase in anger and anxiety symptoms, likely related to PMDD/Menstrual Cycle. Discussed PMDD symptoms and management options. - Increase Zoloft  to  100 mg daily during the week before menstruation. - Monitor symptoms and adjust dosage as needed.   Insomnia, unspecified type Assessment & Plan: Hydroxyzine  effective for sleep. No current refill needed. - Continue hydroxyzine  as needed for sleep. - Ensure refills are available at the pharmacy.      Return in about 4 weeks (around 09/05/2024) for ADHD, mood .    Saddie JULIANNA Sacks, PA-C

## 2024-08-08 NOTE — Assessment & Plan Note (Signed)
 Hydroxyzine  effective for sleep. No current refill needed. - Continue hydroxyzine  as needed for sleep. - Ensure refills are available at the pharmacy.

## 2024-08-08 NOTE — Assessment & Plan Note (Signed)
 Anxiety managed with Zoloft  50 mg daily. Recent increase in anger and anxiety symptoms, likely related to PMDD/Menstrual Cycle. Discussed PMDD symptoms and management options. - Increase Zoloft  to 100 mg daily during the week before menstruation. - Monitor symptoms and adjust dosage as needed.

## 2024-08-29 ENCOUNTER — Ambulatory Visit

## 2024-09-04 ENCOUNTER — Telehealth (INDEPENDENT_AMBULATORY_CARE_PROVIDER_SITE_OTHER)

## 2024-09-04 DIAGNOSIS — F909 Attention-deficit hyperactivity disorder, unspecified type: Secondary | ICD-10-CM

## 2024-09-04 DIAGNOSIS — F411 Generalized anxiety disorder: Secondary | ICD-10-CM | POA: Diagnosis not present

## 2024-09-04 MED ORDER — SERTRALINE HCL 100 MG PO TABS: 100.0000 mg | ORAL_TABLET | Freq: Every day | ORAL | 3 refills | Status: AC

## 2024-09-04 MED ORDER — DEXMETHYLPHENIDATE HCL ER 25 MG PO CP24: 25.0000 mg | ORAL_CAPSULE | Freq: Every day | ORAL | 0 refills | Status: DC

## 2024-09-04 NOTE — Assessment & Plan Note (Signed)
 ADHD symptoms better controlled with Focalin  15 mg XR daily.  Symptoms are not fully controlled at this dosage.  Will increase to Focalin  25 mg XR daily.  Will hold off on adding an afternoon dose for now.  Advised patient to send a MyChart message in a couple of weeks to update on efficacy and notify if dosage adjustments are needed.  30-day supply of Focalin  25 mg XR sent to pharmacy today.  Follow-up in 3 months for ADHD check/med refills.

## 2024-09-04 NOTE — Assessment & Plan Note (Signed)
 Anxiety improved with Zoloft  100 mg daily.  Have sent in a refill for 100 mg tablets to consolidate pill number.  Advised to continue this medication and notify if anxiety worsens over time.  Will continue to monitor.

## 2024-09-04 NOTE — Progress Notes (Signed)
 "  Virtual Visit via Video Note  I connected with Areatha Prader on 09/04/2024 at 11:10 AM EST by a video enabled telemedicine application and verified that I am speaking with the correct person using two identifiers.  Patient Location: Home Provider Location: Home Office  I discussed the limitations, risks, security, and privacy concerns of performing an evaluation and management service by video and the availability of in person appointments. I also discussed with the patient that there may be a patient responsible charge related to this service. The patient expressed understanding and agreed to proceed.  Subjective: PCP: Gayle Saddie FALCON, PA-C  No chief complaint on file.  HPI  Colleen Conrad  is a 18 y.o. female who presents via video visit for follow up on mood and ADHD.   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  She used to be treated with Focalin  and high school for ADHD and that medication worked well for her.  She inquired about restarting this medication before she starts college.  We started Reglan XR 15 mg daily.  Today she reports that she is tolerating this medication well, no side effects.  Does report that the medication is helping with her attention deficit symptoms and improving focus, however not as efficacious as she would like.  She has also been taking Zoloft  100 mg by doubling up her 50 mg tablets.  She reports that this has been working really well for her and she would like to consolidate the number of tablets to 1 tablet a day at 100 mg.  Anxiety is improving.  Denies side effects.  ROS: Per HPI Current Medications[1]  Observations/Objective: There were no vitals filed for this visit. Physical Exam Constitutional:      Appearance: Normal appearance.  Neurological:     General: No focal deficit present.     Mental Status: She is alert and oriented to person, place, and time. Mental status is at  baseline.  Psychiatric:        Mood and Affect: Mood normal.        Behavior: Behavior normal.        Thought Content: Thought content normal.        Judgment: Judgment normal.    Physical exam was limited due to the media of this visit being via video   Assessment and Plan: Attention deficit hyperactivity disorder (ADHD), unspecified ADHD type Assessment & Plan: ADHD symptoms better controlled with Focalin  15 mg XR daily.  Symptoms are not fully controlled at this dosage.  Will increase to Focalin  25 mg XR daily.  Will hold off on adding an afternoon dose for now.  Advised patient to send a MyChart message in a couple of weeks to update on efficacy and notify if dosage adjustments are needed.  30-day supply of Focalin  25 mg XR sent to pharmacy today.  Follow-up in 3 months for ADHD check/med refills.  Orders: -     Dexmethylphenidate  HCl ER; Take 1 capsule (25 mg total) by mouth daily.  Dispense: 30 capsule; Refill: 0  Generalized anxiety disorder Assessment & Plan: Anxiety improved with Zoloft  100 mg daily.  Have sent in a refill for 100 mg tablets to consolidate pill number.  Advised to continue this medication and notify if anxiety worsens over time.  Will continue to monitor.  Orders: -     Sertraline  HCl; Take 1 tablet (100 mg total) by mouth daily.  Dispense:  30 tablet; Refill: 3    Follow Up Instructions: Return in about 3 months (around 12/03/2024) for ADHD/Mood.   I discussed the assessment and treatment plan with the patient. The patient was provided an opportunity to ask questions, and all were answered. The patient agreed with the plan and demonstrated an understanding of the instructions.   The patient was advised to call back or seek an in-person evaluation if the symptoms worsen or if the condition fails to improve as anticipated.  The above assessment and management plan was discussed with the patient. The patient verbalized understanding of and has agreed to the  management plan.   Saddie JULIANNA Sacks, PA-C     [1]  Current Outpatient Medications:    Dexmethylphenidate  HCl 25 MG CP24, Take 1 capsule (25 mg total) by mouth daily., Disp: 30 capsule, Rfl: 0   sertraline  (ZOLOFT ) 100 MG tablet, Take 1 tablet (100 mg total) by mouth daily., Disp: 30 tablet, Rfl: 3   hydrOXYzine  (VISTARIL ) 50 MG capsule, TAKE 1 CAPSULE BY MOUTH 3 TIMES DAILY AS NEEDED., Disp: 30 capsule, Rfl: 0   omeprazole  (PRILOSEC) 20 MG capsule, Take 1 capsule (20 mg total) by mouth daily., Disp: 30 capsule, Rfl: 0  "

## 2024-09-07 ENCOUNTER — Other Ambulatory Visit: Payer: Self-pay

## 2024-09-07 ENCOUNTER — Telehealth: Payer: Self-pay

## 2024-09-07 DIAGNOSIS — F909 Attention-deficit hyperactivity disorder, unspecified type: Secondary | ICD-10-CM

## 2024-09-07 MED ORDER — DEXMETHYLPHENIDATE HCL ER 25 MG PO CP24: 25.0000 mg | ORAL_CAPSULE | Freq: Every day | ORAL | 0 refills | Status: DC

## 2024-09-07 NOTE — Telephone Encounter (Signed)
 CRM acknowledged and Rx sent to [facility]     Copied from CRM [id number]. Topic: Clinical - Prescription Issue >> Sep 05, 2024  9:05 AM Colleen Conrad wrote: Reason for CRM: Dexmethylphenidate  HCl 25 MG CP24 Was sent to [facility] - [place], [place] - [address], but is not available.    Patient would like medication sent to   [facility] [address] ([address]), [place] - [address] [place] ([place]) [place] [address] Phone: [phone] Fax: [phone]

## 2024-09-10 ENCOUNTER — Other Ambulatory Visit: Payer: Self-pay

## 2024-09-10 DIAGNOSIS — F411 Generalized anxiety disorder: Secondary | ICD-10-CM

## 2024-09-10 DIAGNOSIS — F909 Attention-deficit hyperactivity disorder, unspecified type: Secondary | ICD-10-CM

## 2024-09-28 MED ORDER — DEXMETHYLPHENIDATE HCL ER 25 MG PO CP24: 25.0000 mg | ORAL_CAPSULE | Freq: Every day | ORAL | 0 refills | Status: AC
